# Patient Record
Sex: Male | Born: 1990 | Race: White | Hispanic: No | Marital: Single | Smoking: Never smoker
Health system: Southern US, Community
[De-identification: ages and names within clinical notes are randomized; demographics above are authoritative.]

## PROBLEM LIST (undated history)

## (undated) DIAGNOSIS — F1290 Cannabis use, unspecified, uncomplicated: Secondary | ICD-10-CM

## (undated) DIAGNOSIS — F32A Depression, unspecified: Secondary | ICD-10-CM

## (undated) DIAGNOSIS — Z62819 Personal history of unspecified abuse in childhood: Secondary | ICD-10-CM

## (undated) HISTORY — DX: Personal history of unspecified abuse in childhood: Z62.819

## (undated) HISTORY — DX: Depression, unspecified: F32.A

## (undated) HISTORY — DX: Cannabis use, unspecified, uncomplicated: F12.90

---

## 2020-11-12 ENCOUNTER — Emergency Department: Payer: Self-pay

## 2020-11-12 ENCOUNTER — Other Ambulatory Visit: Payer: Self-pay

## 2020-11-12 ENCOUNTER — Encounter: Payer: Self-pay | Admitting: Emergency Medicine

## 2020-11-12 ENCOUNTER — Emergency Department
Admission: EM | Admit: 2020-11-12 | Discharge: 2020-11-12 | Disposition: A | Discharge status: Home / Self Care | Payer: Self-pay | Attending: Emergency Medicine | Admitting: Emergency Medicine

## 2020-11-12 DIAGNOSIS — M25532 Pain in left wrist: Secondary | ICD-10-CM

## 2020-11-12 DIAGNOSIS — Z23 Encounter for immunization: Secondary | ICD-10-CM | POA: Insufficient documentation

## 2020-11-12 DIAGNOSIS — S0181XA Laceration without foreign body of other part of head, initial encounter: Secondary | ICD-10-CM | POA: Insufficient documentation

## 2020-11-12 DIAGNOSIS — Y9351 Activity, roller skating (inline) and skateboarding: Secondary | ICD-10-CM | POA: Insufficient documentation

## 2020-11-12 DIAGNOSIS — S52502A Unspecified fracture of the lower end of left radius, initial encounter for closed fracture: Secondary | ICD-10-CM | POA: Insufficient documentation

## 2020-11-12 DIAGNOSIS — M7989 Other specified soft tissue disorders: Secondary | ICD-10-CM | POA: Insufficient documentation

## 2020-11-12 MED ORDER — TETANUS-DIPHTH-ACELL PERTUSSIS 5-2.5-18.5 LF-MCG/0.5 IM SUSY
0.5000 mL | PREFILLED_SYRINGE | Freq: Once | INTRAMUSCULAR | Status: AC
  Administered 2020-11-12: 0.5 mL via INTRAMUSCULAR
  Filled 2020-11-12: qty 0.5

## 2020-11-12 MED ORDER — LIDOCAINE HCL (PF) 1 % IJ SOLN
5.0000 mL | Freq: Once | INTRAMUSCULAR | Status: AC
  Administered 2020-11-12: 5 mL via INTRADERMAL
  Filled 2020-11-12: qty 5

## 2020-11-12 MED ORDER — CEPHALEXIN 500 MG PO CAPS: 500.0000 mg | ORAL_CAPSULE | Freq: Four times a day (QID) | ORAL | 0 refills | Status: AC

## 2020-11-12 MED ORDER — OXYCODONE-ACETAMINOPHEN 5-325 MG PO TABS: 1.0000 | ORAL_TABLET | Freq: Four times a day (QID) | ORAL | 0 refills | Status: AC | PRN

## 2020-11-12 MED ORDER — ACETAMINOPHEN 325 MG PO TABS
650.0000 mg | ORAL_TABLET | Freq: Once | ORAL | Status: AC
  Administered 2020-11-12: 650 mg via ORAL
  Filled 2020-11-12: qty 2

## 2020-11-12 MED ORDER — CEPHALEXIN 500 MG PO CAPS
1000.0000 mg | ORAL_CAPSULE | Freq: Once | ORAL | Status: AC
  Administered 2020-11-12: 1000 mg via ORAL
  Filled 2020-11-12: qty 2

## 2020-11-12 MED ORDER — OXYCODONE-ACETAMINOPHEN 5-325 MG PO TABS
1.0000 | ORAL_TABLET | Freq: Once | ORAL | Status: AC
  Administered 2020-11-12: 1 via ORAL
  Filled 2020-11-12: qty 1

## 2020-11-12 NOTE — ED Provider Notes (Signed)
[facility] Emergency Department Provider Note  ____________________________________________   Event Date/Time   First MD Initiated Contact with Patient 11/12/20 1424     (approximate)  I have reviewed the triage vital signs and the nursing notes.   HISTORY  Chief Complaint Laceration and Wrist Pain   HPI Juan Robles is a 30 y.o. male who presents to the ER for evaluation of multiple facial lacerations as well as left wrist pain after a fall while skateboarding.  Patient states that he was wearing his helmet.  He states that after falling, he immediately popped back up, denies any loss of consciousness, nausea, vomiting.  He denies any neck pain.  He is unsure of his position of his left wrist at the time of the fall, but has noted swelling to the area since that time.  He is unsure of the date of his last tetanus.       History reviewed. No pertinent past medical history.  There are no problems to display for this patient.   History reviewed. No pertinent surgical history.  Prior to Admission medications   Medication Sig Start Date End Date Taking? Authorizing Provider  cephALEXin (KEFLEX) 500 MG capsule Take 1 capsule (500 mg total) by mouth 4 (four) times daily for 7 days. 11/12/20 11/19/20 Yes Tiegan Jambor, Ruben Gottronaitlin J, PA  oxyCODONE-acetaminophen (PERCOCET) 5-325 MG tablet Take 1 tablet by mouth every 6 (six) hours as needed for up to 3 days for severe pain. 11/12/20 11/15/20 Yes Lucy Chrisodgers, Arleth Mccullar J, PA    Allergies Patient has no allergy information on record.  No family history on file.  Social History    Review of Systems Constitutional: No fever/chills Eyes: No visual changes. ENT: No sore throat. Cardiovascular: Denies chest pain. Respiratory: Denies shortness of breath. Gastrointestinal: No abdominal pain.  No nausea, no vomiting.  No diarrhea.  No constipation. Genitourinary: Negative for dysuria. Musculoskeletal: + Left wrist pain,  negative for back pain. Skin: + Multiple facial lacerations Neurological: Negative for headaches, focal weakness or numbness.  ____________________________________________   PHYSICAL EXAM:  VITAL SIGNS: ED Triage Vitals  Enc Vitals Group     BP 11/12/20 1427 (!) 144/68     Pulse Rate 11/12/20 1427 100     Resp 11/12/20 1427 18     Temp 11/12/20 1427 100 F (37.8 C)     Temp Source 11/12/20 1427 Oral     SpO2 11/12/20 1427 100 %     Weight 11/12/20 1422 160 lb (72.6 kg)     Height 11/12/20 1422 5\' 8"  (1.727 m)     Head Circumference --      Peak Flow --      Pain Score 11/12/20 1421 7     Pain Loc --      Pain Edu? --      Excl. in GC? --    Constitutional: Alert and oriented. Well appearing and in no acute distress. Eyes: Conjunctivae are normal. PERRL. EOMI. Head: 2 facial lacerations as described below.  Otherwise atraumatic. Nose: No congestion/rhinnorhea. Mouth/Throat: Patient is able to open and close the lower mandible without any difficulty, no tenderness at the bilateral TMJs.  Mucous membranes are moist.  Oropharynx non-erythematous. Neck: No stridor.  No tenderness to palpation of the midline or paraspinals of the cervical spine.  Full range of motion.  Grip strength equal and symmetric. Cardiovascular: Normal rate, regular rhythm. Grossly normal heart sounds.  Good peripheral circulation. Respiratory: Normal respiratory effort.  No  retractions. Lungs CTAB. Musculoskeletal: No tenderness to palpation of the midline or paraspinals of the thoracic or lumbar spine.  There is left wrist swelling and tenderness of the distal radius.  No anatomic snuffbox tenderness.  Patient still able to grip.  Range of motion limited secondary to pain.  Radial pulse 2+, capillary refill less than 3 seconds all digits. Neurologic:  Normal speech and language. No gross focal neurologic deficits are appreciated. No gait instability. Skin: There are 2 facial lacerations.  First, is just  inferior to the lower lip, does not cross the vermilion border.  It is approximately 2 cm, horizontal in nature and parallel to the vermilion border.  Minimal active bleeding present.  Surrounding facial hair present.  The second laceration is a 2 cm V-shaped laceration just under the chin with active bleeding.  There is also significant amount of surrounding facial hair. Psychiatric: Mood and affect are normal. Speech and behavior are normal.  ____________________________________________  RADIOLOGY I, Lucy Chris, personally viewed and evaluated these images (plain radiographs) as part of my medical decision making, as well as reviewing the written report by the radiologist.  ED provider interpretation: Distal radius fracture noted on left wrist film, otherwise no other fracture identified.  See radiology report for CT findings.  Official radiology report(s): DG Wrist Complete Left  Result Date: 11/12/2020 CLINICAL DATA:  Pain after fall. EXAM: LEFT WRIST - COMPLETE 3+ VIEW COMPARISON:  None. FINDINGS: There is a fracture through the radial metaphysis extending into the radiocarpal joint. A fractures along the ulnar aspect of the radial metaphysis. IMPRESSION: Fracture through the radial metaphysis extending into the radiocarpal joint as above. Electronically Signed   By: Gerome Sam III M.D   On: 11/12/2020 14:55   CT Maxillofacial Wo Contrast  Result Date: 11/12/2020 CLINICAL DATA:  The patient fell and hit his chin while skateboarding. EXAM: CT MAXILLOFACIAL WITHOUT CONTRAST TECHNIQUE: Multidetector CT imaging of the maxillofacial structures was performed. Multiplanar CT image reconstructions were also generated. COMPARISON:  None. FINDINGS: Osseous: No fracture or mandibular dislocation. No destructive process. Orbits: Negative. No traumatic or inflammatory finding. Sinuses: Mild bilateral inferior maxillary sinus mucosal thickening. Soft tissues: Soft tissue laceration beneath the  anterior aspect of the mandible. Limited intracranial: No significant or unexpected finding. IMPRESSION: Soft tissue laceration of the chin without fracture or dislocation. Electronically Signed   By: Beckie Salts M.D.   On: 11/12/2020 15:21    ____________________________________________   PROCEDURES  Procedure(s) performed (including Critical Care):  Marland KitchenMarland KitchenLaceration Repair  Date/Time: 11/13/2020 9:29 AM Performed by: Lucy Chris, PA Authorized by: Lucy Chris, PA   Consent:    Consent obtained:  Verbal   Consent given by:  Patient   Risks, benefits, and alternatives were discussed: yes     Risks discussed:  Infection, pain, retained foreign body, vascular damage, need for additional repair, poor cosmetic result and poor wound healing   Alternatives discussed:  No treatment, delayed treatment and referral Universal protocol:    Procedure explained and questions answered to patient or proxy's satisfaction: yes     Imaging studies available: yes     Immediately prior to procedure, a time out was called: yes     Patient identity confirmed:  Verbally with patient Anesthesia:    Anesthesia method:  Local infiltration   Local anesthetic:  Lidocaine 1% w/o epi Laceration details:    Location:  Face   Face location:  Chin   Length (cm):  2  Depth (mm):  5 Pre-procedure details:    Preparation:  Patient was prepped and draped in usual sterile fashion and imaging obtained to evaluate for foreign bodies Exploration:    Imaging obtained comment:  CT   Imaging outcome: foreign body not noted     Wound exploration: wound explored through full range of motion and entire depth of wound visualized     Contaminated: yes   Treatment:    Area cleansed with:  Povidone-iodine and saline   Amount of cleaning:  Extensive   Irrigation method:  Syringe and tap Skin repair:    Repair method:  Sutures   Suture size:  6-0   Suture material:  Prolene   Suture technique:  Simple  interrupted   Number of sutures:  4 Approximation:    Approximation:  Close Repair type:    Repair type:  Simple Post-procedure details:    Dressing:  Open (no dressing)   Procedure completion:  Tolerated well, no immediate complications .Marland KitchenLaceration Repair  Date/Time: 11/13/2020 9:39 AM Performed by: Lucy Chris, PA Authorized by: Lucy Chris, PA   Consent:    Consent obtained:  Verbal   Consent given by:  Patient   Risks, benefits, and alternatives were discussed: yes     Risks discussed:  Infection, pain, need for additional repair, poor wound healing, retained foreign body and vascular damage   Alternatives discussed:  No treatment, delayed treatment and referral Universal protocol:    Procedure explained and questions answered to patient or proxy's satisfaction: yes     Imaging studies available: yes     Patient identity confirmed:  Verbally with patient Anesthesia:    Anesthesia method:  Local infiltration   Local anesthetic:  Lidocaine 1% w/o epi Laceration details:    Location:  Face   Face location:  Chin   Length (cm):  2   Depth (mm):  5 Pre-procedure details:    Preparation:  Patient was prepped and draped in usual sterile fashion and imaging obtained to evaluate for foreign bodies Exploration:    Limited defect created (wound extended): yes     Hemostasis achieved with:  Direct pressure   Imaging obtained comment:  CT   Imaging outcome: foreign body not noted     Wound exploration: wound explored through full range of motion and entire depth of wound visualized     Contaminated: yes   Treatment:    Area cleansed with:  Povidone-iodine and saline   Irrigation method:  Syringe and tap Skin repair:    Repair method:  Sutures   Suture size:  6-0   Suture material:  Prolene   Suture technique:  Simple interrupted   Number of sutures:  4 Approximation:    Approximation:  Close Repair type:    Repair type:  Simple Post-procedure details:     Dressing:  Open (no dressing)   Procedure completion:  Tolerated well, no immediate complications .Ortho Injury Treatment  Date/Time: 11/13/2020 9:40 AM Performed by: Lucy Chris, PA Authorized by: Lucy Chris, PA   Consent:    Consent obtained:  Verbal   Consent given by:  Patient   Risks discussed:  Fracture, restricted joint movement, vascular damage and stiffness   Alternatives discussed:  No treatment and referralInjury location: wrist Location details: left wrist Injury type: fracture Fracture type: distal radius Pre-procedure neurovascular assessment: neurovascularly intact Pre-procedure distal perfusion: normal Pre-procedure neurological function: normal Pre-procedure range of motion: reduced  Anesthesia: Local anesthesia used: no  Patient sedated: NoManipulation performed: no Immobilization: splint and sling Splint type: volar short arm Splint Applied by: ED Tech Supplies used: Ortho-Glass, cotton padding and elastic bandage Post-procedure neurovascular assessment: post-procedure neurovascularly intact Post-procedure distal perfusion: normal Post-procedure neurological function: normal     ____________________________________________   INITIAL IMPRESSION / ASSESSMENT AND PLAN / ED COURSE  As part of my medical decision making, I reviewed the following data within the electronic MEDICAL RECORD NUMBER Nursing notes reviewed and incorporated, Labs reviewed, Radiograph reviewed, Notes from prior ED visits, and [place] Controlled Substance Database        Patient is a 30 year old male who presents to the emergency department for evaluation after a fall while skateboarding.  He did not lose consciousness, have nausea or vomiting, denies neck pain.  Reports left wrist pain with 2 facial lacerations.  See HPI for further details.  In triage patient has grossly normal vital signs.  Physical exam as above.  Notably, the patient has 2 facial lacerations on the chin.   There is no tenderness to palpation of the bony structures of the face, normal neuro exam, no tenderness of the cervical spine.  CT of the maxillofacial region was obtained is negative for acute fracture.  Lacerations were repaired, see above.  He was instructed to have these removed in 5 days.  Started on prophylactic Keflex given location, Tdap updated.  In regards to the left wrist he does have tenderness and swelling noted with decreased range of motion.  X-ray demonstrates an acute radius fracture.  He has normal neurovascular status, no snuffbox tenderness.  Placed the patient in a short arm splint with instructions for orthopedics follow-up.  He was given a short course of narcotic pain medication to include with his OTC Tylenol and ibuprofen, he was given appropriate dosages.  Patient is stable at this time for outpatient follow-up, return precautions were discussed.      ____________________________________________   FINAL CLINICAL IMPRESSION(S) / ED DIAGNOSES  Final diagnoses:  Left wrist pain  Closed fracture of distal end of left radius, unspecified fracture morphology, initial encounter  Facial laceration, initial encounter     ED Discharge Orders          Ordered    cephALEXin (KEFLEX) 500 MG capsule  4 times daily        11/12/20 1631    oxyCODONE-acetaminophen (PERCOCET) 5-325 MG tablet  Every 6 hours PRN        11/12/20 1631             Note:  This document was prepared using Dragon voice recognition software and may include unintentional dictation errors.    Lucy Chris, PA 11/13/20 1047    Jene Every, MD 11/13/20 1714

## 2020-11-12 NOTE — Discharge Instructions (Addendum)
Please take antibiotic as prescribed. Return to the ER if you develop any worsening. Wear splint at all times, follow up with orthopedics. You may take Tylenol, Ibuprofen and the prescribed Percocet for your pain. Tylenol is up to 650 mg every 6 hours, Ibuprofen 800mg  every 8 hours.

## 2020-11-12 NOTE — ED Notes (Signed)
Left wrist splinted w/ orthoglass short arm. Pt denies pain, CMS intact.

## 2020-11-12 NOTE — ED Triage Notes (Signed)
Pt reports was skateboarding and fell cutting below his lip and chin. Bleeding controlled at this time. No LOC

## 2020-11-18 ENCOUNTER — Ambulatory Visit: Payer: Self-pay

## 2020-11-18 NOTE — Telephone Encounter (Signed)
  Pt. Reports he had sutures placed 11/12/20 to lip and chin after falling from skateboard. Was instructed to have them removed in 5 days. Today is day 6. Instructed to go to UC for removal. Given [facility] in [place] phone and address.    Message from Areatha Keas sent at 11/18/2020 11:18 AM EDT  Summary: stitches advise   Pt had stiches put in this past Saturday (6 days ago) / they are on his lip and chin / pt called to get an appt with [facility]/ I advised him of offices excepting New Patients   He would like to get stitches removed and wanted advice on if he can wait until next week to get a New patient appt wit Dr. Ashley Royalty at [facility] or if he needs to get them out sooner at an urgent care or ER/ please advise             Call History   Type Contact Phone/Fax User  11/18/2020 11:15 AM EDT Phone (Incoming) Jams, Trickett (Self) [phone] Rexene Edison) [place] Hough E    Reason for Disposition  Suture (or staple) removal date, questions about  Answer Assessment - Initial Assessment Questions 1. LOCATION: "Where are the sutures (or staples) located?"      Chin and lower lip 2. NUMBER: "How many sutures (or staples) are there?"      4 in each laceration 3. DATE IN: "When were the sutures (or staples) put in?"       11/12/20 4. DATE OUT: "When did your doctor tell you the sutures (or staples) needed to come out?"     5 days 5. OTHER SYMPTOMS: "Do you have any other symptoms?" (e.g., wound pain, discharge, fever?)     No 6. PREGNANCY: "Is there any chance you are pregnant?" "When was your last menstrual period?"     N/a  Protocols used: Suture or Staple Questions-A-AH

## 2021-12-26 DIAGNOSIS — R509 Fever, unspecified: Secondary | ICD-10-CM | POA: Diagnosis not present

## 2021-12-26 DIAGNOSIS — K581 Irritable bowel syndrome with constipation: Secondary | ICD-10-CM | POA: Diagnosis not present

## 2021-12-26 DIAGNOSIS — K580 Irritable bowel syndrome with diarrhea: Secondary | ICD-10-CM | POA: Diagnosis not present

## 2021-12-26 DIAGNOSIS — R000 Tachycardia, unspecified: Secondary | ICD-10-CM | POA: Diagnosis not present

## 2021-12-26 DIAGNOSIS — R10814 Left lower quadrant abdominal tenderness: Secondary | ICD-10-CM | POA: Diagnosis not present

## 2021-12-26 DIAGNOSIS — R1032 Left lower quadrant pain: Secondary | ICD-10-CM | POA: Diagnosis not present

## 2021-12-26 DIAGNOSIS — K625 Hemorrhage of anus and rectum: Secondary | ICD-10-CM | POA: Diagnosis not present

## 2021-12-26 DIAGNOSIS — R55 Syncope and collapse: Secondary | ICD-10-CM | POA: Diagnosis not present

## 2021-12-26 DIAGNOSIS — K921 Melena: Secondary | ICD-10-CM | POA: Diagnosis not present

## 2021-12-27 DIAGNOSIS — K625 Hemorrhage of anus and rectum: Secondary | ICD-10-CM | POA: Diagnosis not present

## 2021-12-28 DIAGNOSIS — K625 Hemorrhage of anus and rectum: Secondary | ICD-10-CM | POA: Diagnosis not present

## 2022-03-19 DIAGNOSIS — S62664A Nondisplaced fracture of distal phalanx of right ring finger, initial encounter for closed fracture: Secondary | ICD-10-CM | POA: Diagnosis not present

## 2022-03-20 DIAGNOSIS — S62634D Displaced fracture of distal phalanx of right ring finger, subsequent encounter for fracture with routine healing: Secondary | ICD-10-CM | POA: Diagnosis not present

## 2022-03-20 DIAGNOSIS — M20011 Mallet finger of right finger(s): Secondary | ICD-10-CM | POA: Diagnosis not present

## 2022-05-01 DIAGNOSIS — M20011 Mallet finger of right finger(s): Secondary | ICD-10-CM | POA: Diagnosis not present

## 2022-09-25 DIAGNOSIS — M20031 Swan-neck deformity of right finger(s): Secondary | ICD-10-CM | POA: Diagnosis not present

## 2022-09-25 DIAGNOSIS — M20012 Mallet finger of left finger(s): Secondary | ICD-10-CM | POA: Diagnosis not present

## 2022-10-04 IMAGING — CT CT MAXILLOFACIAL W/O CM
3 of 7 series · 15 of 47 positions shown, 19 images · non-contrast
Comparison: None.

CLINICAL DATA: The patient fell and hit his chin while
skateboarding.

EXAM:
CT MAXILLOFACIAL WITHOUT CONTRAST
TECHNIQUE: Multidetector CT imaging of the maxillofacial structures was
performed. Multiplanar CT image reconstructions were also generated.

[Series 8: coronal bone · coronal · 0.37mm/px · 1 of 57 slices shown]
[im 29/57  bone]
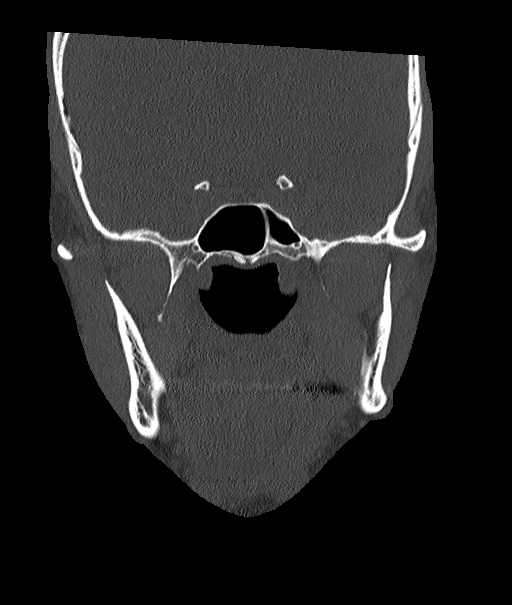

[Series 9: sagittal bone · sagittal · 0.43mm/px · 1 of 82 slices shown]
[im 41/82  bone]
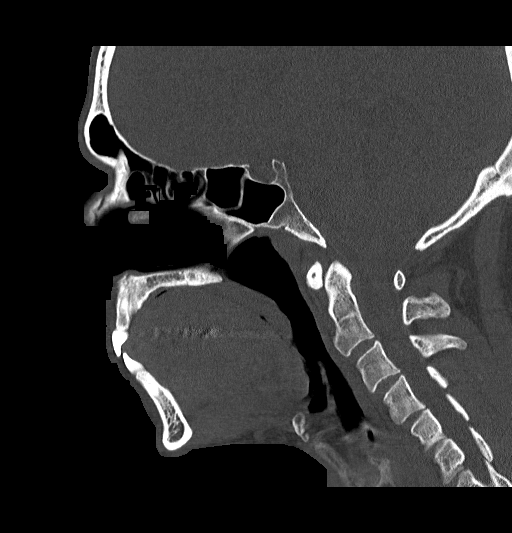

[Series 12: st thins · axial · 0.40mm/px · z∈[-172,-2]mm · 13 of 382 slices shown, 17 images]
[im 21/382  brain]
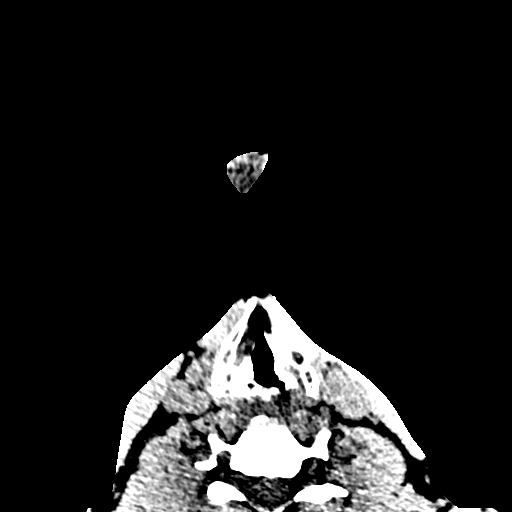
[im 21/382  bone]
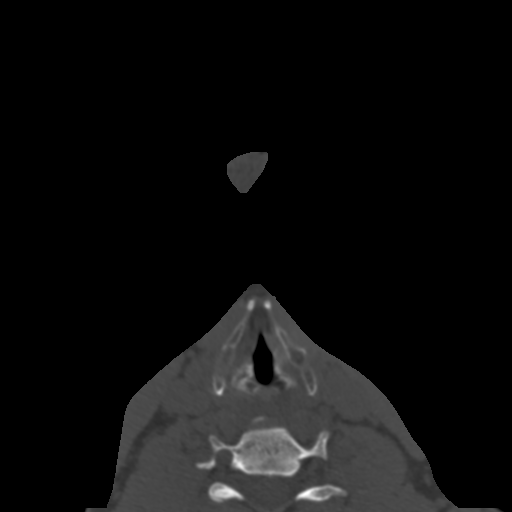
[im 61/382  bone]
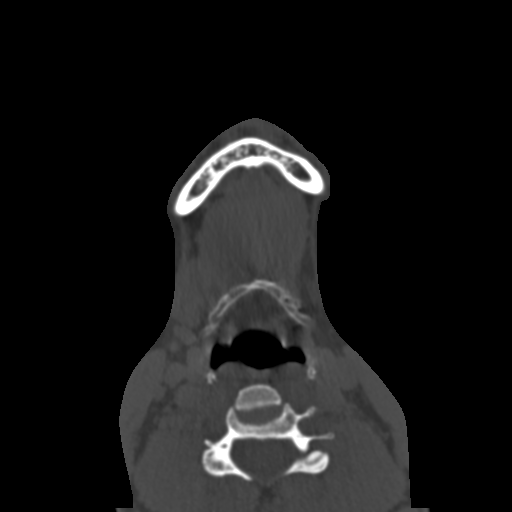
[im 81/382  bone]
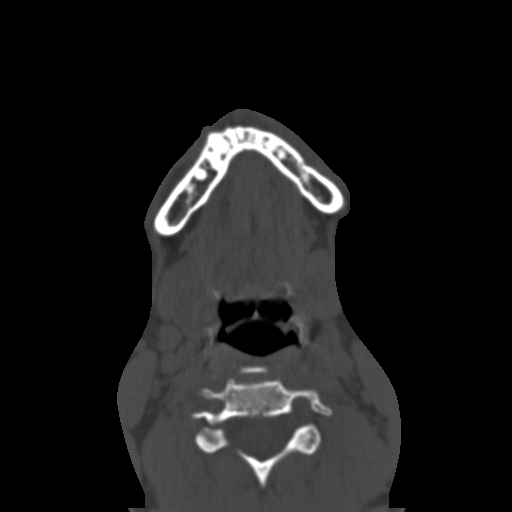
[im 101/382  bone]
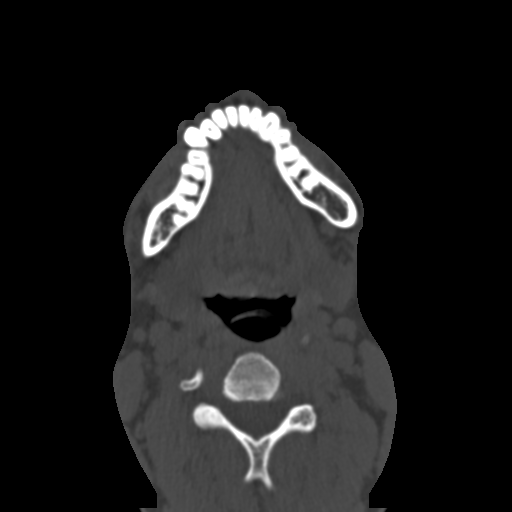
[im 141/382  brain]
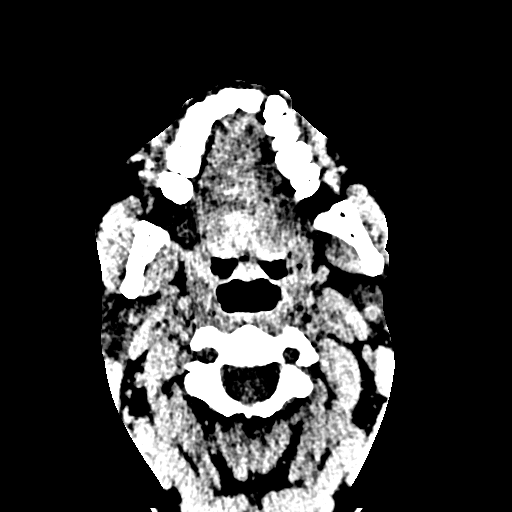
[im 141/382  bone]
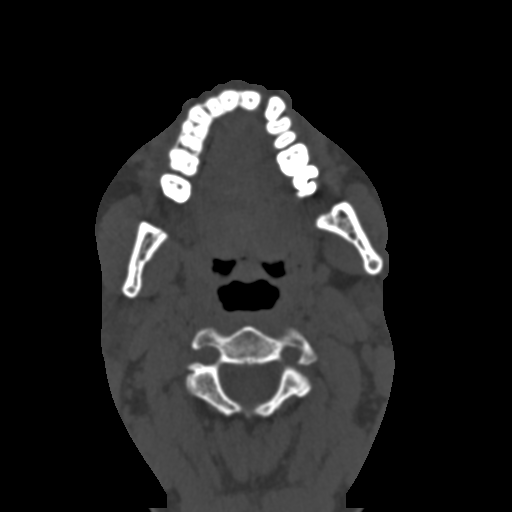
[im 161/382  bone]
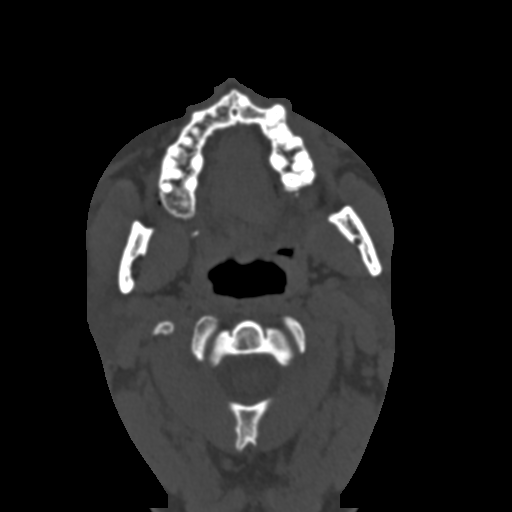
[im 201/382  bone]
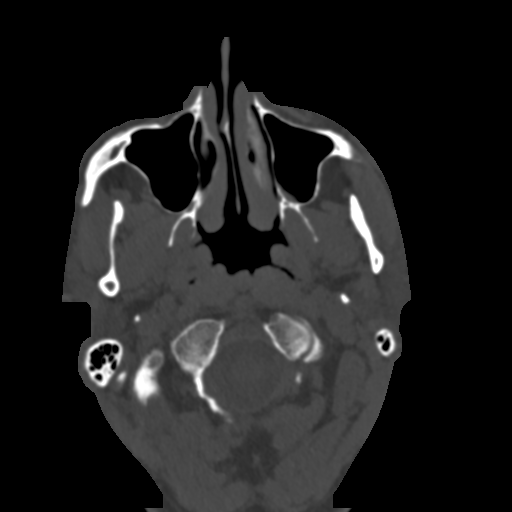
[im 221/382  bone]
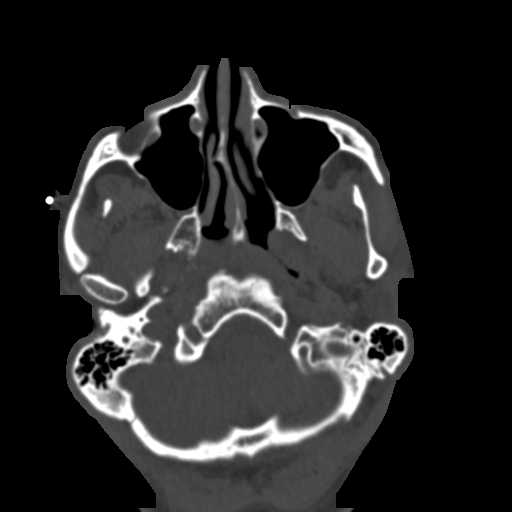
[im 241/382  brain]
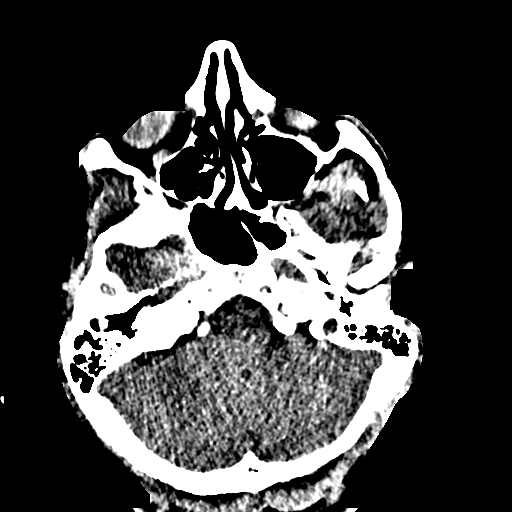
[im 241/382  bone]
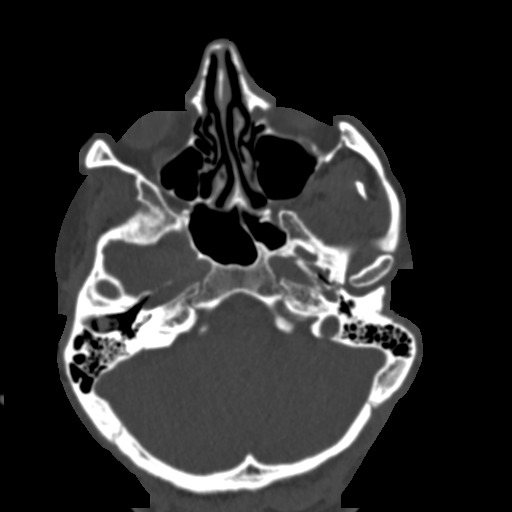
[im 281/382  bone]
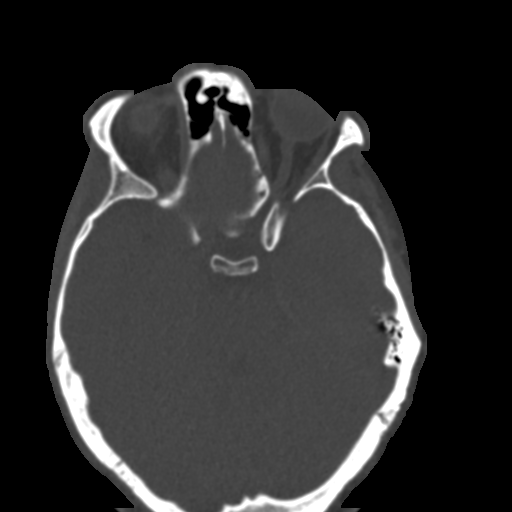
[im 301/382  bone]
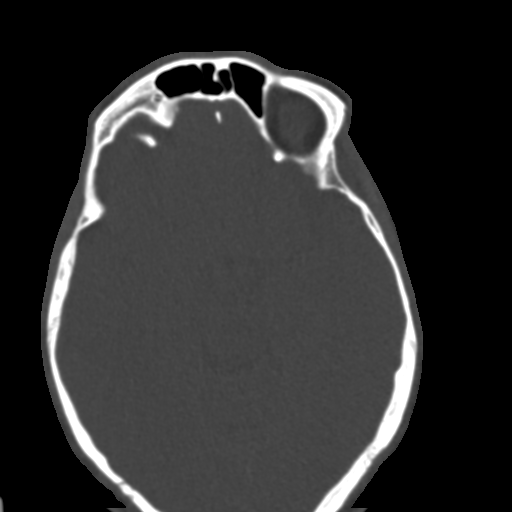
[im 321/382  bone]
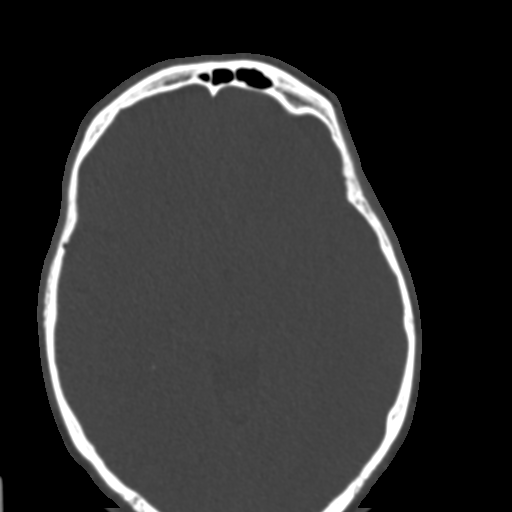
[im 361/382  brain]
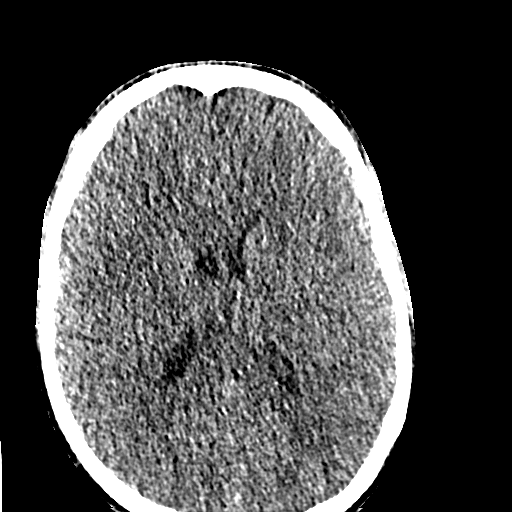
[im 361/382  bone]
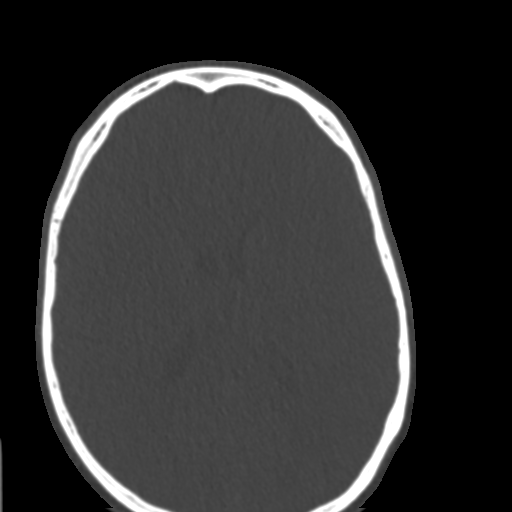

[15 of 47 positions shown; findings below may reference images not displayed]

FINDINGS: Osseous: No fracture or mandibular dislocation. No destructive
process.

Orbits: Negative. No traumatic or inflammatory finding.

Sinuses: Mild bilateral inferior maxillary sinus mucosal thickening.

Soft tissues: Soft tissue laceration beneath the anterior aspect of
the mandible.

Limited intracranial: No significant or unexpected finding.
IMPRESSION: Soft tissue laceration of the chin without fracture or dislocation.

## 2022-10-04 IMAGING — DX DG WRIST COMPLETE 3+V*L*
4 series · 4 of 4 positions shown · non-contrast
Comparison: None.

CLINICAL DATA: Pain after fall.

EXAM:
LEFT WRIST - COMPLETE 3+ VIEW

[wrist ap (1 of 2)]
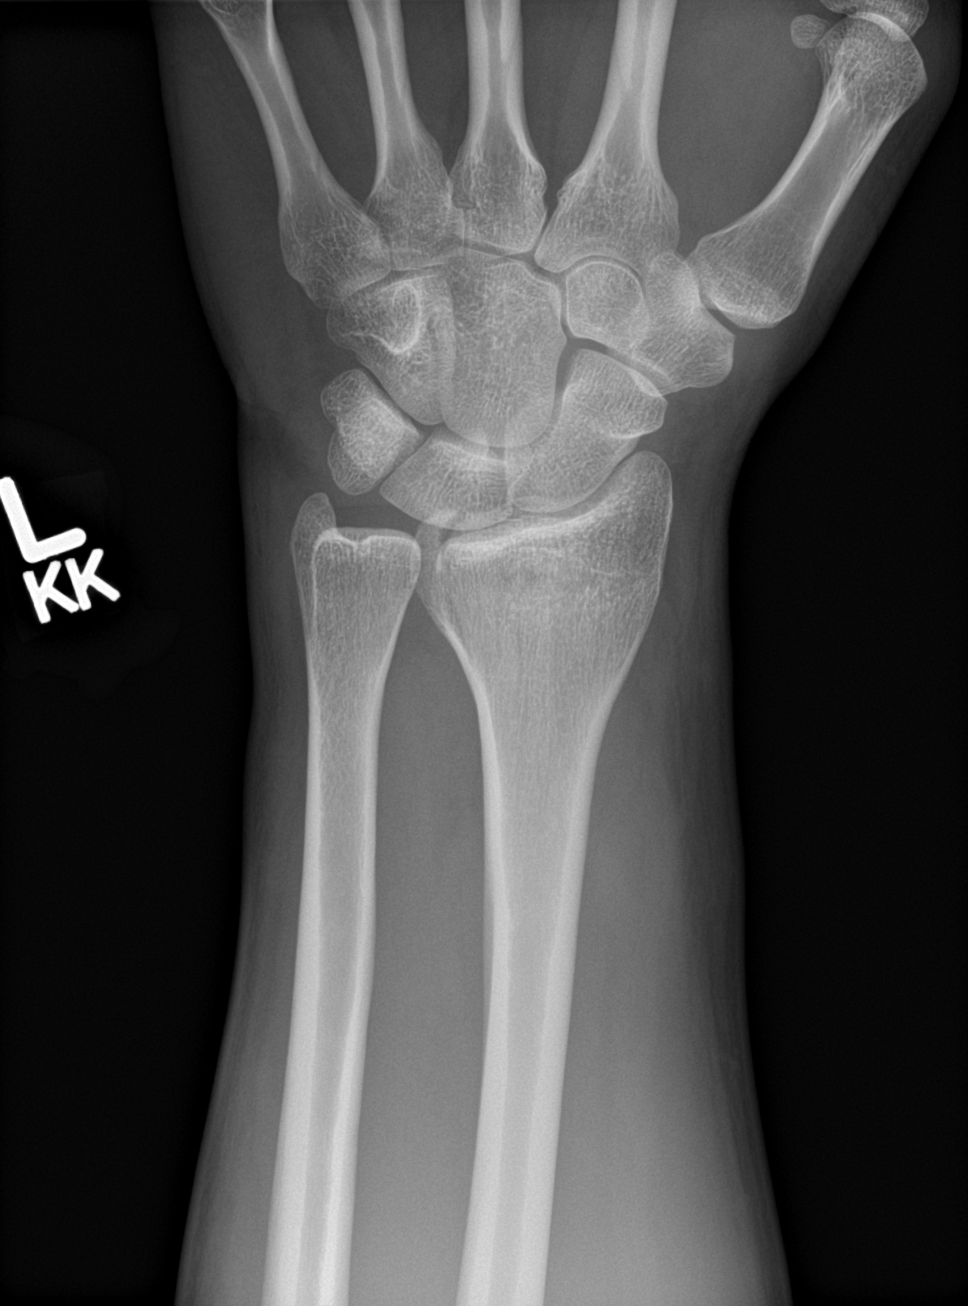

[wrist obl]
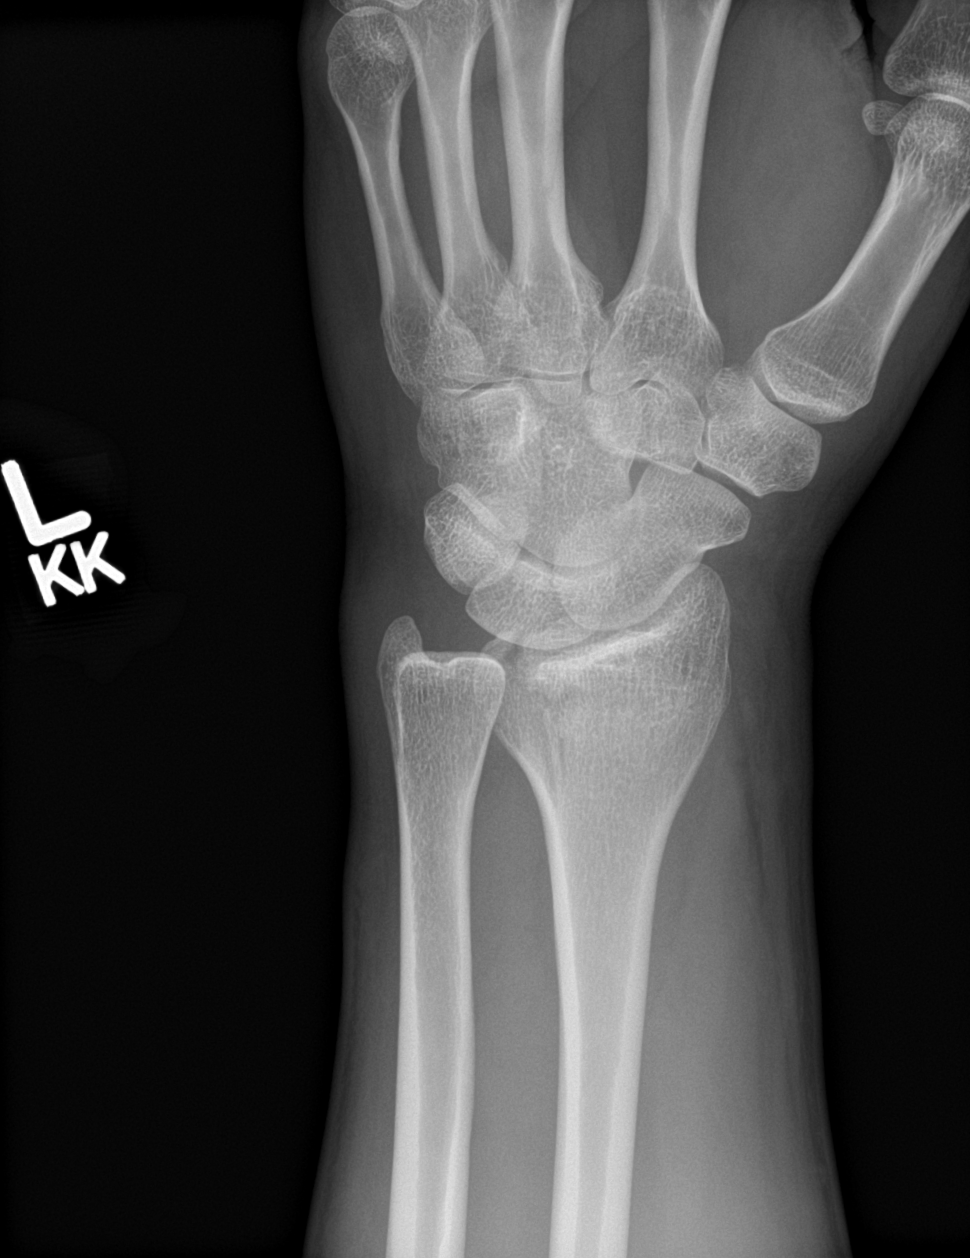

[wrist lat]
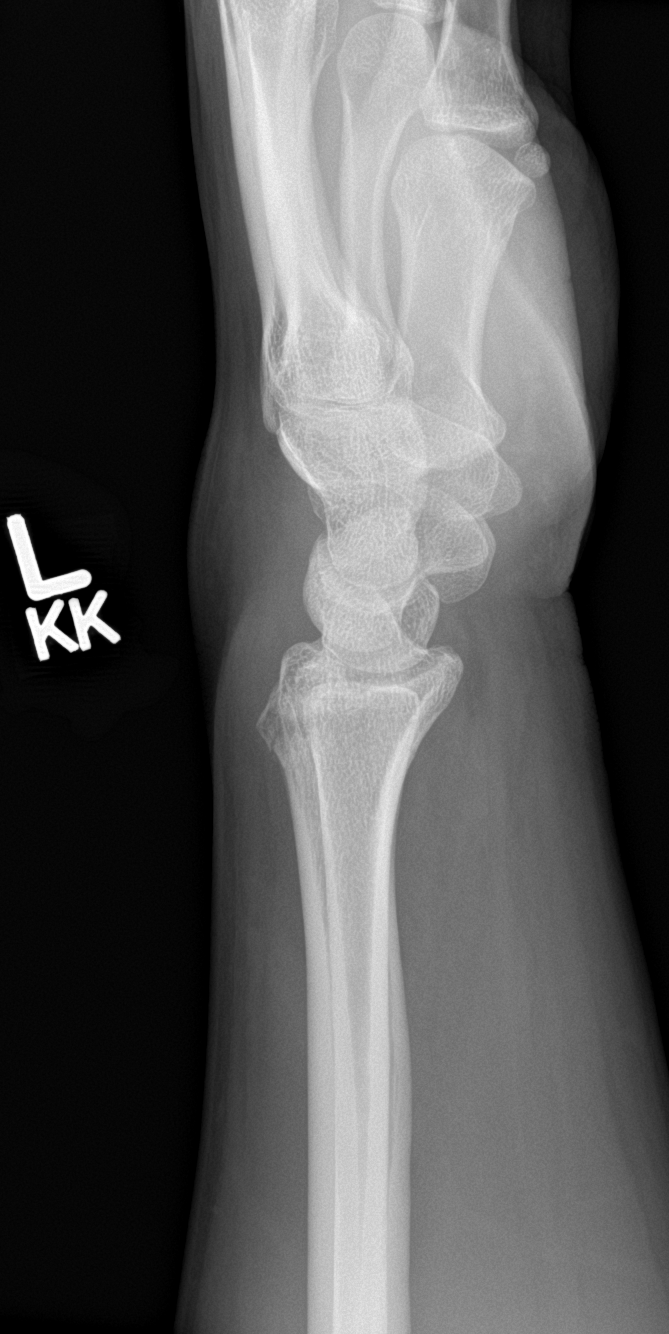

[wrist ap (2 of 2)]
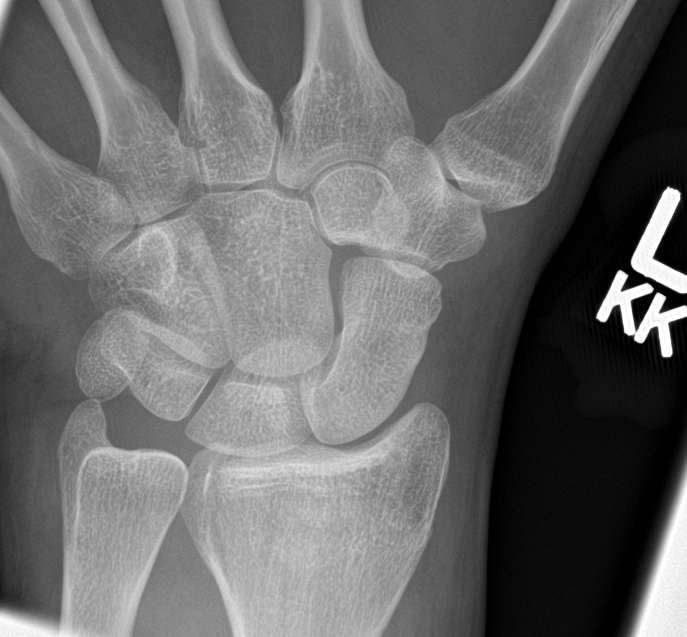

[4 of 4 positions shown; findings below may reference images not displayed]

FINDINGS: There is a fracture through the radial metaphysis extending into the
radiocarpal joint. A fractures along the ulnar aspect of the radial
metaphysis.
IMPRESSION: Fracture through the radial metaphysis extending into the
radiocarpal joint as above.

## 2023-01-08 ENCOUNTER — Encounter: Payer: Self-pay | Admitting: Family Medicine

## 2023-01-08 ENCOUNTER — Ambulatory Visit: Payer: Self-pay | Admitting: Family Medicine

## 2023-01-08 DIAGNOSIS — F32A Depression, unspecified: Secondary | ICD-10-CM

## 2023-01-08 DIAGNOSIS — Z62819 Personal history of unspecified abuse in childhood: Secondary | ICD-10-CM

## 2023-01-08 DIAGNOSIS — F1290 Cannabis use, unspecified, uncomplicated: Secondary | ICD-10-CM

## 2023-01-08 DIAGNOSIS — Z113 Encounter for screening for infections with a predominantly sexual mode of transmission: Secondary | ICD-10-CM

## 2023-01-08 DIAGNOSIS — F419 Anxiety disorder, unspecified: Secondary | ICD-10-CM

## 2023-01-08 LAB — HM HEPATITIS C SCREENING LAB: HM Hepatitis Screen: NEGATIVE

## 2023-01-08 LAB — HEPATITIS B SURFACE ANTIGEN: Hepatitis B Surface Ag: NONREACTIVE

## 2023-01-08 LAB — HM HIV SCREENING LAB: HM HIV Screening: NEGATIVE
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
## 2023-01-08 NOTE — Progress Notes (Unsigned)
Pt here for STI screening.  No symptoms.  No in house labs performed today.  Condoms declined-Giankarlo Leamer, RM

## 2023-01-09 NOTE — Progress Notes (Signed)

## 2023-01-13 LAB — GONOCOCCUS CULTURE

## 2023-01-13 LAB — CHLAMYDIA/GC NAA, CONFIRMATION
Chlamydia trachomatis, NAA: NEGATIVE
Neisseria gonorrhoeae, NAA: NEGATIVE

## 2023-01-30 NOTE — Addendum Note (Signed)
Addended by: Berdie Ogren on: 01/30/2023 02:38 PM   Modules accepted: Orders
